# Patient Record
Sex: Female | Born: 1970 | Race: White | Hispanic: No | Smoking: Never smoker
Health system: Southern US, Community
[De-identification: ages and names within clinical notes are randomized; demographics above are authoritative.]

---

## 2005-04-03 IMAGING — CT CT PARANASAL SINUSES LIMITED
1 series · 16 of 24 positions shown, 20 images · non-contrast
Comparison: none

CLINICAL DATA: Headaches, left ear pain, some facial pressure.
 LIMITED PARANASAL SINUS CT, NO CONTRAST ? 02/23/04 
 Coronal CT images were obtained through the paranasal sinuses.

 There is no evidence of sinus air-fluid levels or mucosal thickening. No significant bone abnormalities are identified. 
 IMPRESSION
 Normal study.

[Series 2: limited sinus · axial · 0.33mm/px · z∈[+16,+101]mm · 16 of 24 slices shown, 20 images]
[im 2/24  brain]
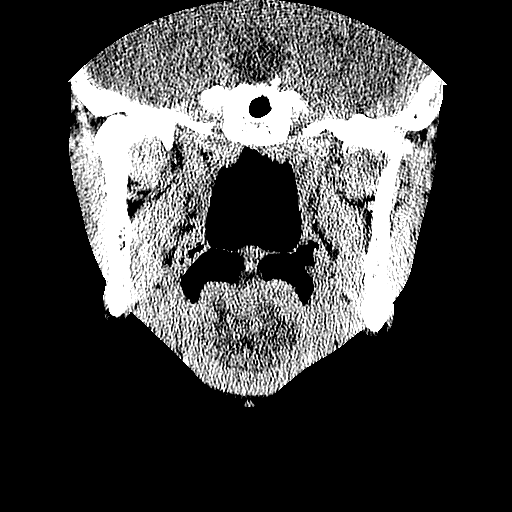
[im 2/24  bone]
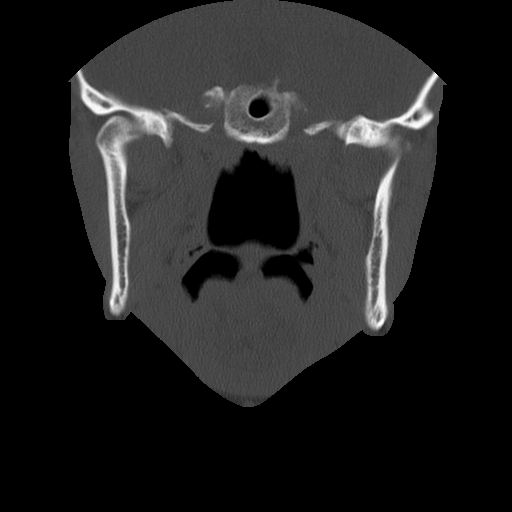
[im 4/24  bone]
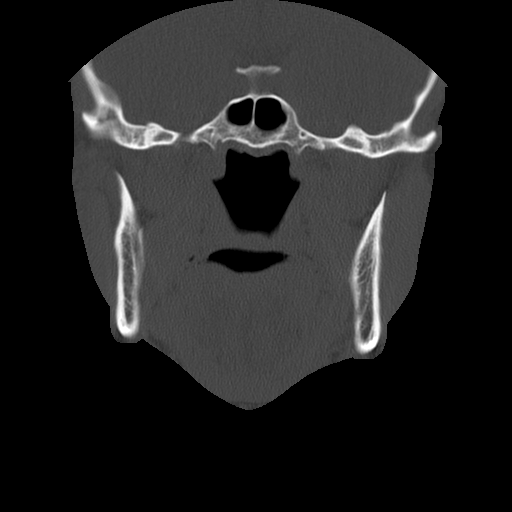
[im 5/24  bone]
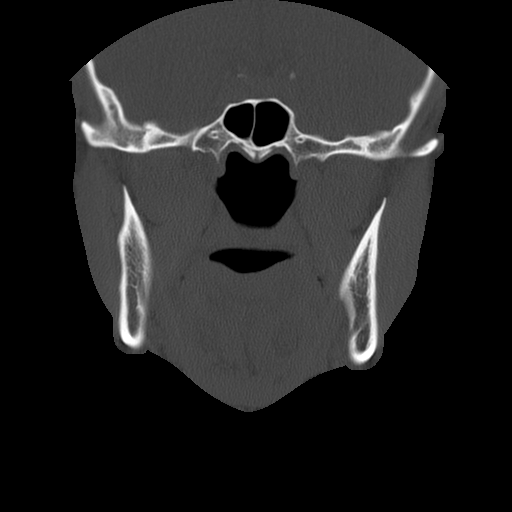
[im 6/24  bone]
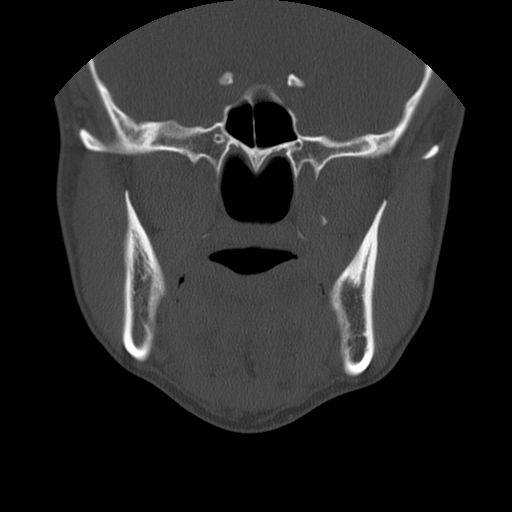
[im 8/24  brain]
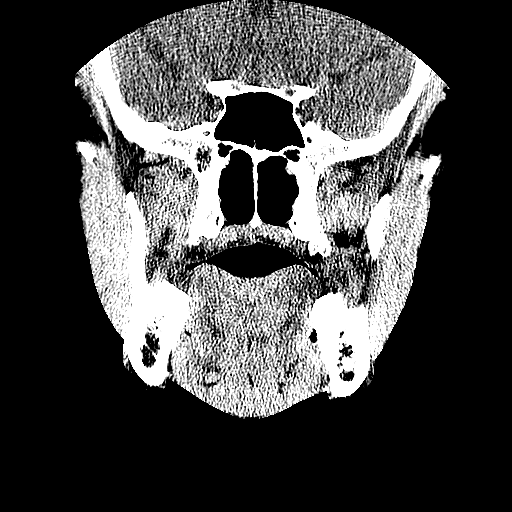
[im 8/24  bone]
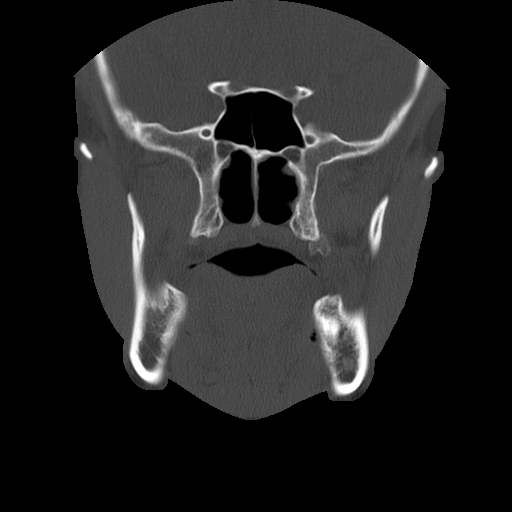
[im 9/24  bone]
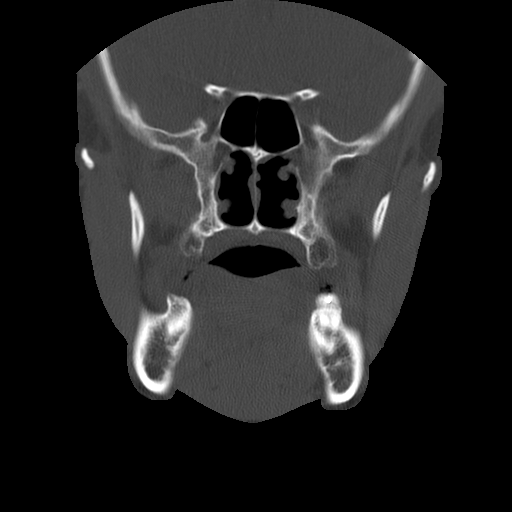
[im 10/24  bone]
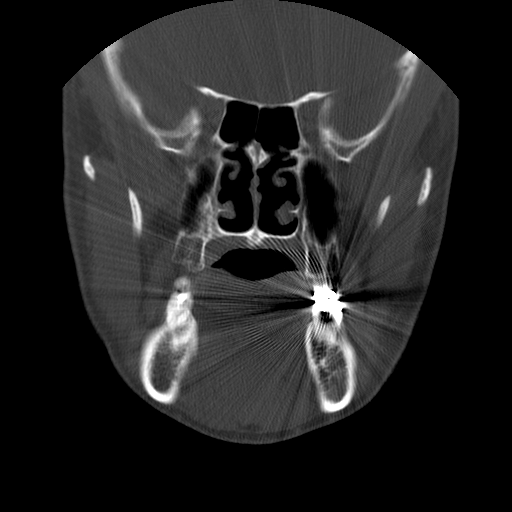
[im 12/24  bone]
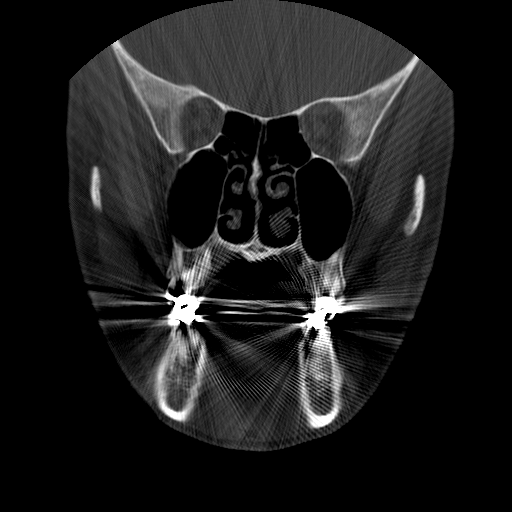
[im 13/24  brain]
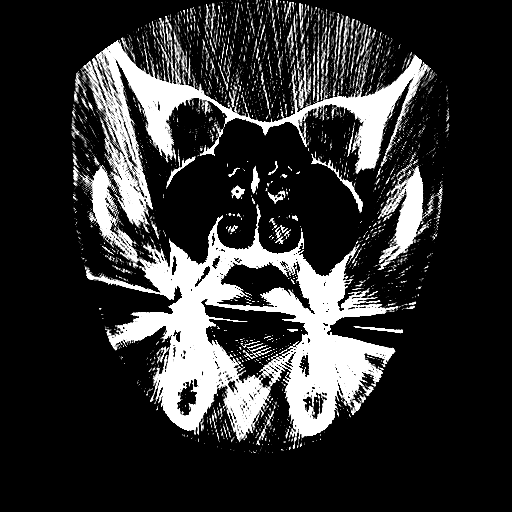
[im 13/24  bone]
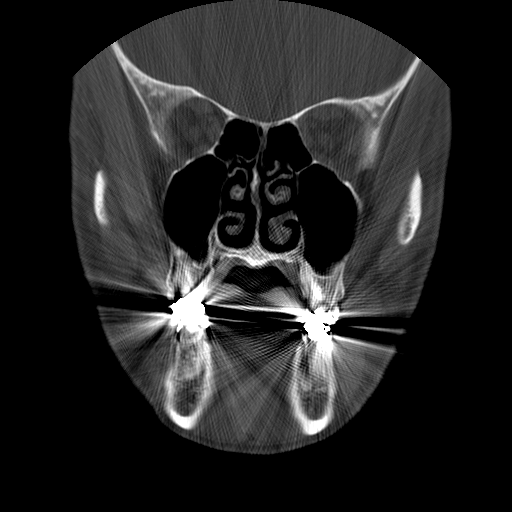
[im 15/24  bone]
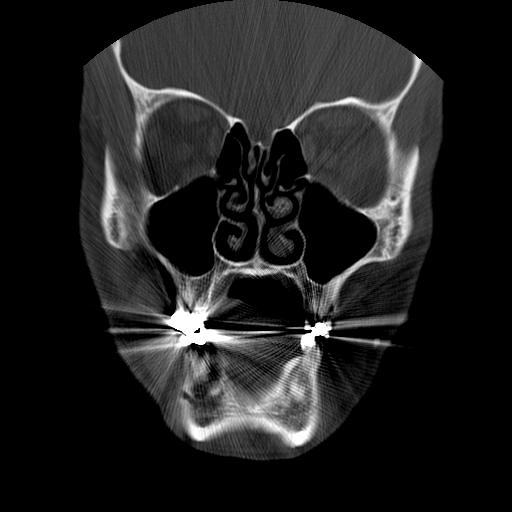
[im 16/24  bone]
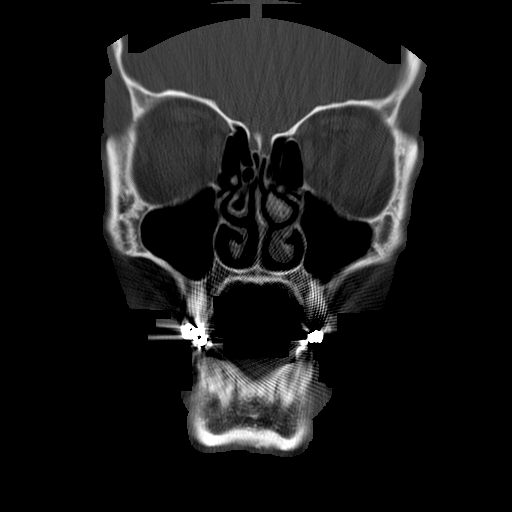
[im 17/24  bone]
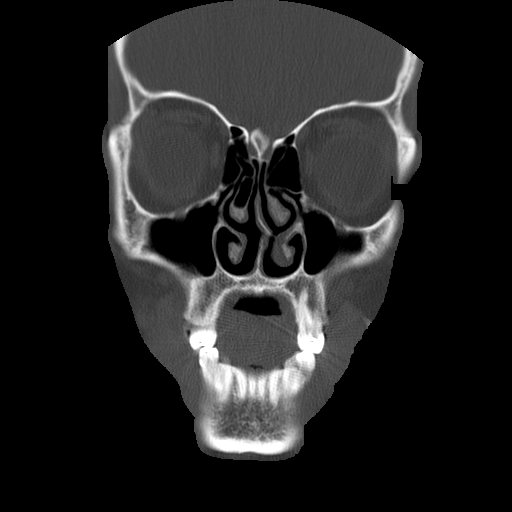
[im 19/24  brain]
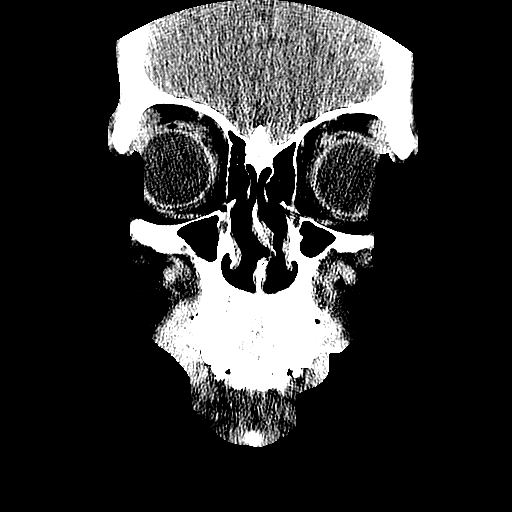
[im 19/24  bone]
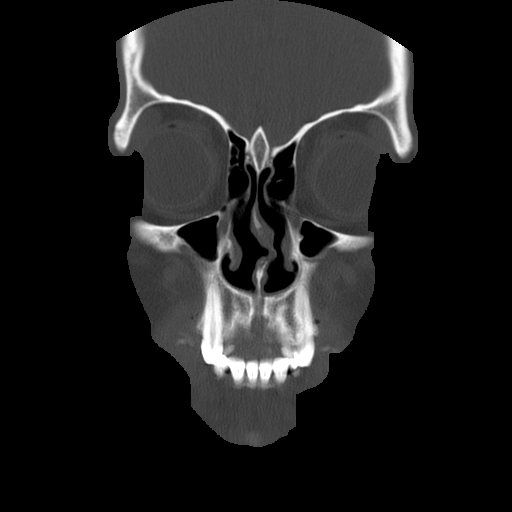
[im 20/24  bone]
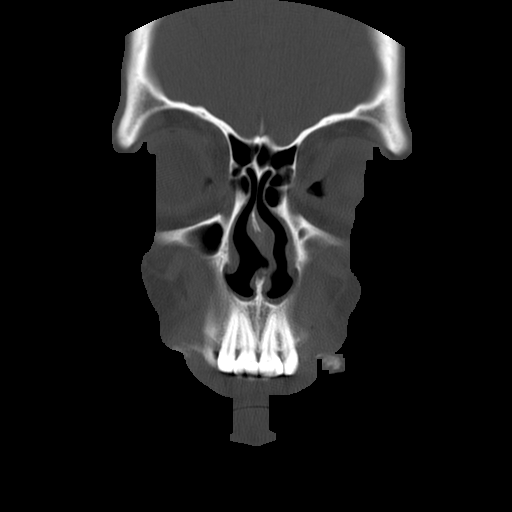
[im 21/24  bone]
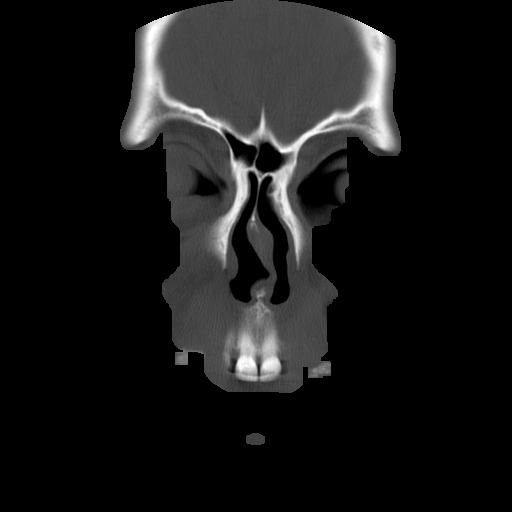
[im 23/24  bone]
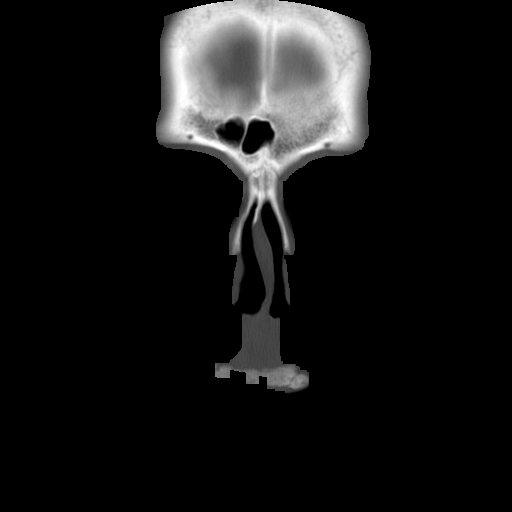

[16 of 24 positions shown; findings below may reference images not displayed]

## 2017-06-29 ENCOUNTER — Encounter (INDEPENDENT_AMBULATORY_CARE_PROVIDER_SITE_OTHER): Payer: Self-pay

## 2017-07-07 ENCOUNTER — Encounter: Payer: Self-pay | Admitting: Podiatry

## 2017-07-07 ENCOUNTER — Ambulatory Visit (INDEPENDENT_AMBULATORY_CARE_PROVIDER_SITE_OTHER): Payer: BLUE CROSS/BLUE SHIELD

## 2017-07-07 ENCOUNTER — Ambulatory Visit (INDEPENDENT_AMBULATORY_CARE_PROVIDER_SITE_OTHER): Payer: BLUE CROSS/BLUE SHIELD | Admitting: Podiatry

## 2017-07-07 VITALS — BP 105/75 | HR 87 | Ht 66.0 in | Wt 190.0 lb

## 2017-07-07 DIAGNOSIS — M76829 Posterior tibial tendinitis, unspecified leg: Secondary | ICD-10-CM

## 2017-07-07 DIAGNOSIS — R269 Unspecified abnormalities of gait and mobility: Secondary | ICD-10-CM

## 2017-07-07 DIAGNOSIS — M722 Plantar fascial fibromatosis: Secondary | ICD-10-CM

## 2017-07-07 DIAGNOSIS — M659 Synovitis and tenosynovitis, unspecified: Secondary | ICD-10-CM | POA: Diagnosis not present

## 2017-07-07 DIAGNOSIS — M2140 Flat foot [pes planus] (acquired), unspecified foot: Secondary | ICD-10-CM

## 2017-07-07 MED ORDER — TRIAMCINOLONE ACETONIDE 10 MG/ML IJ SUSP: 5.0000 mg | Freq: Once | INTRAMUSCULAR | Status: AC

## 2017-07-07 MED ORDER — DEXAMETHASONE SODIUM PHOSPHATE 120 MG/30ML IJ SOLN: 4.0000 mg | Freq: Once | INTRAMUSCULAR | Status: AC

## 2017-07-07 MED ORDER — MELOXICAM 15 MG PO TABS: 15.0000 mg | ORAL_TABLET | Freq: Every day | ORAL | 0 refills | Status: DC

## 2017-07-08 NOTE — Progress Notes (Signed)
  Subjective:  Patient ID: Michelle Robbins, female    DOB: July 18, 1971,  MRN: [id number] HPI Chief Complaint  Patient presents with  . Foot Pain    excrutiating left heel pain for 3 weeks     46 y.o. female presents with the above complaint. Has been having severe L heel pain x3 weeks. Worst in the AM and with periods of inactivity. Denies known inciting event. Has tried icing without relief.   No past medical history on file. No past surgical history on file.  Current Outpatient Prescriptions:  .  clonazePAM (KLONOPIN) 0.5 MG tablet, Take 0.5 mg by mouth daily., Disp: , Rfl: 0 .  lisinopril (PRINIVIL,ZESTRIL) 10 MG tablet, Take 10 mg by mouth daily., Disp: , Rfl: 0 .  QUEtiapine (SEROQUEL) 50 MG tablet, Take 100 mg by mouth at bedtime., Disp: , Rfl: 0 .  vitamin E 1000 UNIT capsule, Take by mouth., Disp: , Rfl:  .  meloxicam (MOBIC) 15 MG tablet, Take 1 tablet (15 mg total) by mouth daily., Disp: 30 tablet, Rfl: 0  Current Facility-Administered Medications:  .  dexamethasone (DECADRON) injection 4 mg, 4 mg, Intra-articular, Once, Price, Nolon BussingMichael J, DPM .  triamcinolone acetonide (KENALOG) 10 MG/ML injection 5 mg, 5 mg, Other, Once, Park LiterPrice, Michael J, DPM  No Known Allergies Review of Systems Objective:   Vitals:   07/07/17 1412  BP: 105/75  Pulse: 87   General AA&O x3. Normal mood and affect.  Vascular Dorsalis pedis and posterior tibial pulses  present 2+ bilaterally  Capillary refill normal to all digits. Pedal hair growth normal.  Neurologic Epicritic sensation grossly present bilaterally.  Dermatologic No open lesions. Interspaces clear of maceration. Nails well groomed and normal in appearance.  Orthopedic: MMT 5/5 in dorsiflexion, plantarflexion, inversion, and eversion bilaterally. Tender to palpation at the calcaneal tuber left. Tender to palpation L PTT. No pain with calcaneal squeeze left. Ankle ROM diminished range of motion left. Silfverskiold Test: positive  left.   Radiographs: Taken and reviewed. No acute fractures. No evidence of calcaneal stress fracture. Pes planus noted.  Assessment & Plan:  Patient was evaluated and treated and all questions answered.  Plantar Fasciitis, left - XR reviewed as above.  - Educated on icing and stretching. Instructions given.  - Injection delivered as below.  PTTD, left - Attempted to dispense ankle brace. Did not have size to accomodate patient. Patient to return to pick them up. Medically necessary to protect and support the posterior tibial tendon. - Educated on proper shoe gear.  Procedure: Injection Tendon/Ligament Location: Left plantar fascia at the glabrous junction; medial approach. Skin Prep: Alcohol. Injectate: 1 cc 0.5% marcaine plain, 1cc dexamethasone, 0.5 cc kenalog 10. Disposition: Patient tolerated procedure well. Injection site dressed with a band-aid.  Return in about 6 weeks (around 08/18/2017).

## 2017-08-04 ENCOUNTER — Other Ambulatory Visit: Payer: Self-pay | Admitting: Podiatry

## 2017-08-04 DIAGNOSIS — M722 Plantar fascial fibromatosis: Secondary | ICD-10-CM

## 2017-08-04 MED ORDER — MELOXICAM 15 MG PO TABS: 15.0000 mg | ORAL_TABLET | Freq: Every day | ORAL | 0 refills | Status: DC

## 2017-08-12 ENCOUNTER — Telehealth: Payer: Self-pay | Admitting: *Deleted

## 2017-08-12 DIAGNOSIS — M722 Plantar fascial fibromatosis: Secondary | ICD-10-CM

## 2017-08-12 MED ORDER — MELOXICAM 15 MG PO TABS: 15.0000 mg | ORAL_TABLET | Freq: Every day | ORAL | 0 refills | Status: AC

## 2017-08-12 NOTE — Telephone Encounter (Signed)
Refill request meloxicam 7.5mg . Dr. Samuella Cota states refill once, pt needs an appt.

## 2017-08-18 ENCOUNTER — Ambulatory Visit: Payer: BLUE CROSS/BLUE SHIELD | Admitting: Podiatry

## 2020-01-31 ENCOUNTER — Other Ambulatory Visit: Payer: Self-pay | Admitting: Otolaryngology

## 2020-01-31 DIAGNOSIS — R221 Localized swelling, mass and lump, neck: Secondary | ICD-10-CM

## 2020-02-15 ENCOUNTER — Other Ambulatory Visit: Payer: Self-pay | Admitting: Otolaryngology

## 2020-02-15 DIAGNOSIS — R221 Localized swelling, mass and lump, neck: Secondary | ICD-10-CM

## 2020-02-17 ENCOUNTER — Ambulatory Visit
Admission: RE | Admit: 2020-02-17 | Discharge: 2020-02-17 | Disposition: A | Discharge status: Home / Self Care | Payer: BC Managed Care – PPO | Source: Ambulatory Visit | Attending: Otolaryngology | Admitting: Otolaryngology

## 2020-02-17 DIAGNOSIS — R221 Localized swelling, mass and lump, neck: Secondary | ICD-10-CM

## 2020-02-17 MED ORDER — IOPAMIDOL (ISOVUE-300) INJECTION 61%
75.0000 mL | Freq: Once | INTRAVENOUS | Status: AC | PRN
  Administered 2020-02-17: 75 mL via INTRAVENOUS

## 2023-05-13 ENCOUNTER — Ambulatory Visit (HOSPITAL_COMMUNITY)
Admission: RE | Admit: 2023-05-13 | Discharge: 2023-05-13 | Disposition: A | Discharge status: Home / Self Care | Payer: Worker's Compensation | Source: Ambulatory Visit | Attending: Cardiology | Admitting: Cardiology

## 2023-05-13 ENCOUNTER — Other Ambulatory Visit (HOSPITAL_COMMUNITY): Payer: Self-pay | Admitting: Family Medicine

## 2023-05-13 DIAGNOSIS — M79662 Pain in left lower leg: Secondary | ICD-10-CM

## 2023-11-26 ENCOUNTER — Other Ambulatory Visit: Payer: Self-pay | Admitting: Specialist

## 2023-11-26 DIAGNOSIS — R1032 Left lower quadrant pain: Secondary | ICD-10-CM

## 2023-11-30 ENCOUNTER — Ambulatory Visit
Admission: RE | Admit: 2023-11-30 | Discharge: 2023-11-30 | Disposition: A | Discharge status: Home / Self Care | Payer: BC Managed Care – PPO | Source: Ambulatory Visit | Attending: Specialist | Admitting: Specialist

## 2023-11-30 DIAGNOSIS — R1032 Left lower quadrant pain: Secondary | ICD-10-CM
